# Patient Record
Sex: Male | Born: 1985 | Race: White | Hispanic: No | Marital: Married | Smoking: Never smoker
Health system: Southern US, Community
[De-identification: ages and names within clinical notes are randomized; demographics above are authoritative.]

## PROBLEM LIST (undated history)

## (undated) HISTORY — PX: LEG SURGERY: SHX1003

---
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
## 2007-08-02 ENCOUNTER — Emergency Department (HOSPITAL_COMMUNITY): Admission: EM | Admit: 2007-08-02 | Discharge: 2007-08-02 | Payer: Self-pay | Admitting: Emergency Medicine

## 2007-11-15 ENCOUNTER — Ambulatory Visit: Payer: Self-pay | Admitting: Unknown Physician Specialty

## 2007-11-16 ENCOUNTER — Ambulatory Visit: Payer: Self-pay | Admitting: Unknown Physician Specialty

## 2008-02-02 ENCOUNTER — Emergency Department: Payer: Self-pay | Admitting: Emergency Medicine

## 2008-02-02 ENCOUNTER — Emergency Department (HOSPITAL_COMMUNITY): Admission: EM | Admit: 2008-02-02 | Discharge: 2008-02-03 | Payer: Self-pay | Admitting: Emergency Medicine

## 2008-02-04 ENCOUNTER — Emergency Department (HOSPITAL_COMMUNITY): Admission: EM | Admit: 2008-02-04 | Discharge: 2008-02-04 | Payer: Self-pay | Admitting: Emergency Medicine

## 2009-05-12 IMAGING — CT CT PELVIS W/O CM
2 of 4 series · 17 of 46 positions shown, 19 images · non-contrast
Comparison: None

CT ABDOMEN

CLINICAL DATA: Right flank pain and lower pelvic pain

CT ABDOMEN AND PELVIS WITHOUT CONTRAST (CT UROGRAM)
TECHNIQUE: Multidetector CT imaging was performed through the
abdomen and pelvis to include the urinary tract.

[Series 2: >200 lbs-stone 5.0 b31f · axial · 0.92mm/px · z∈[+742,+1202]mm · 14 of 102 slices shown, 16 images]
[im 5/102  soft-tissue]
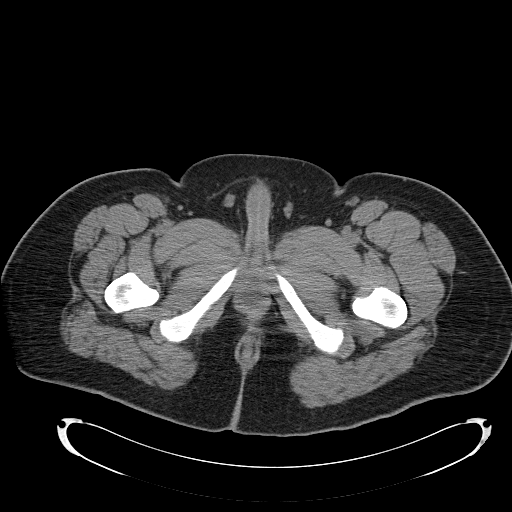
[im 5/102  bone]
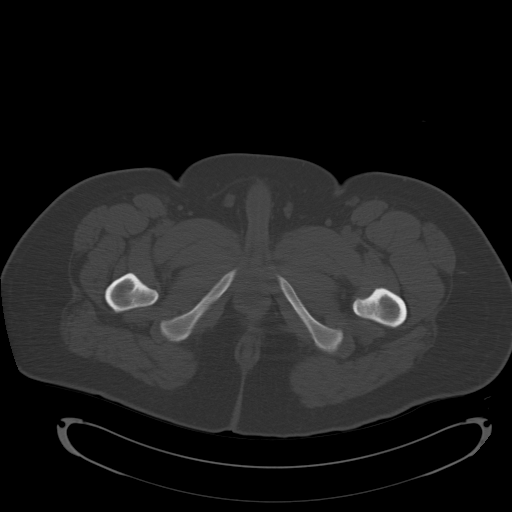
[im 13/102  soft-tissue]
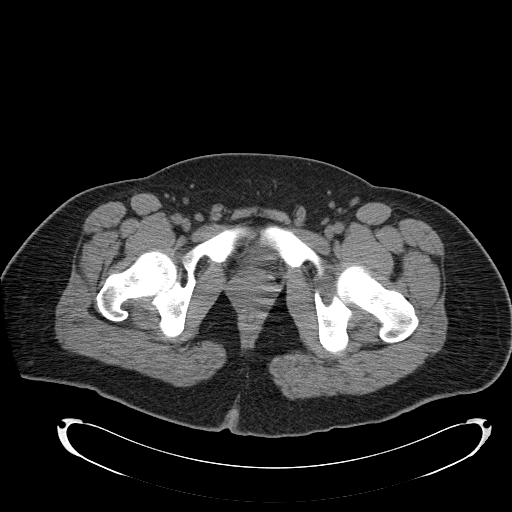
[im 22/102  soft-tissue]
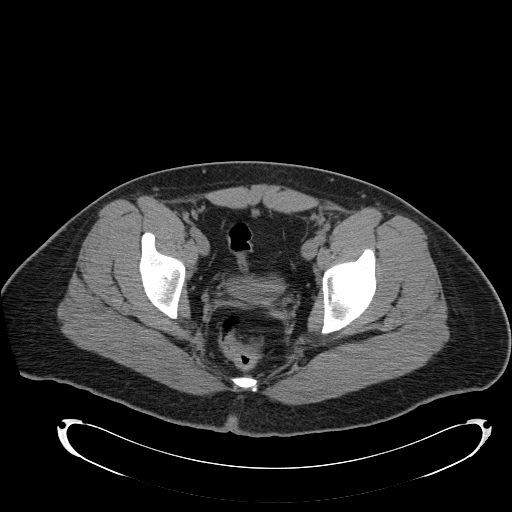
[im 26/102  soft-tissue]
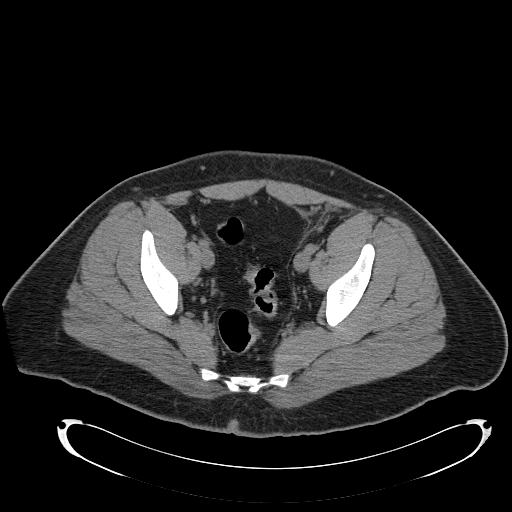
[im 34/102  soft-tissue]
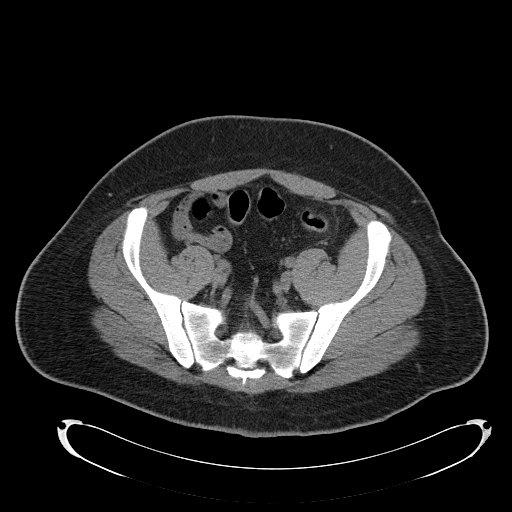
[im 43/102  soft-tissue]
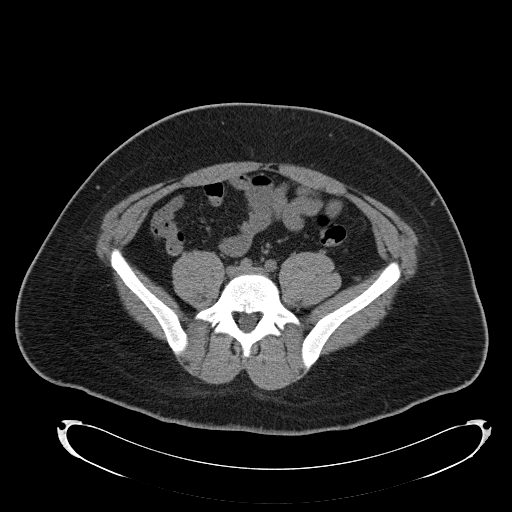
[im 47/102  soft-tissue]
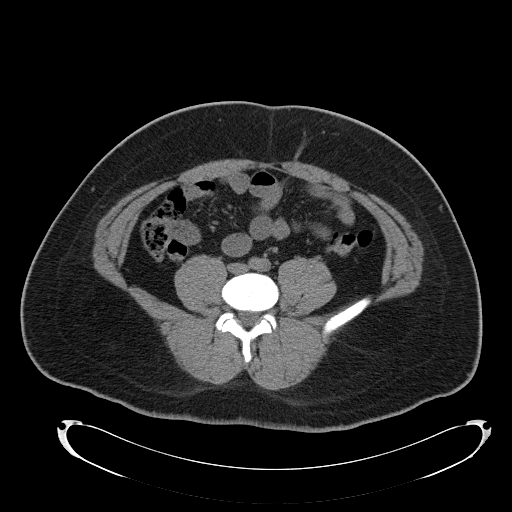
[im 55/102  soft-tissue]
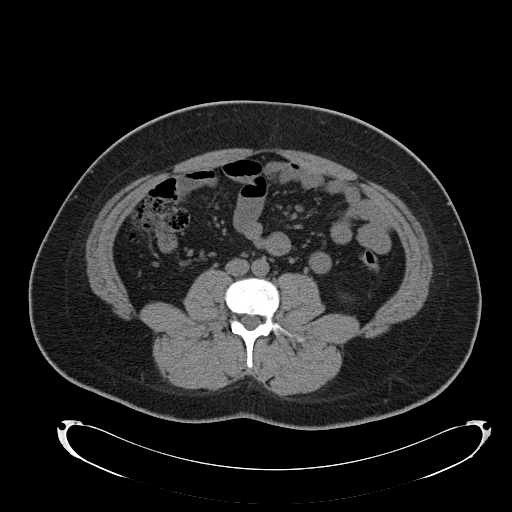
[im 59/102  soft-tissue]
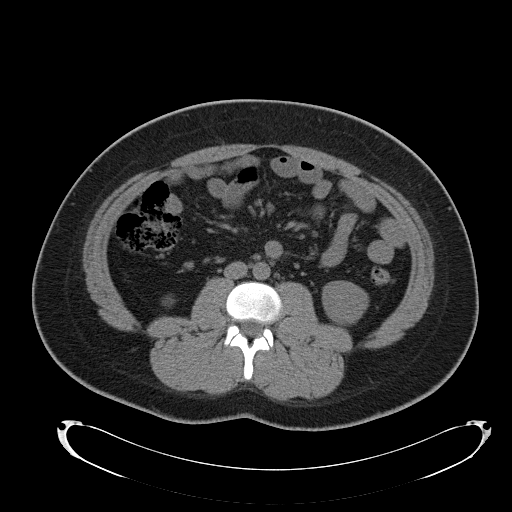
[im 59/102  bone]
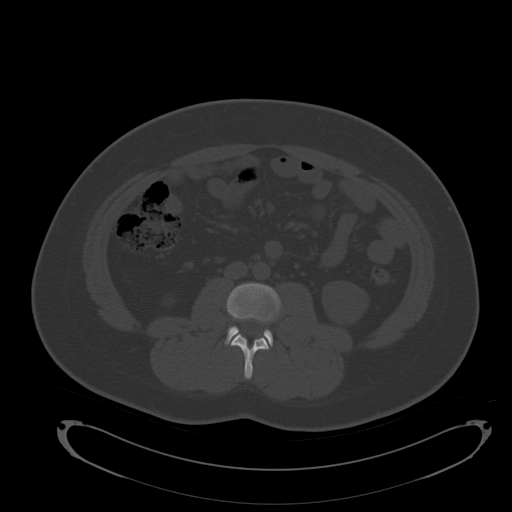
[im 68/102  soft-tissue]
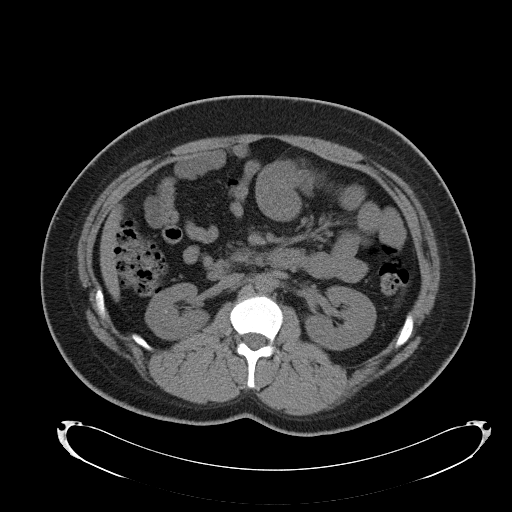
[im 76/102  soft-tissue]
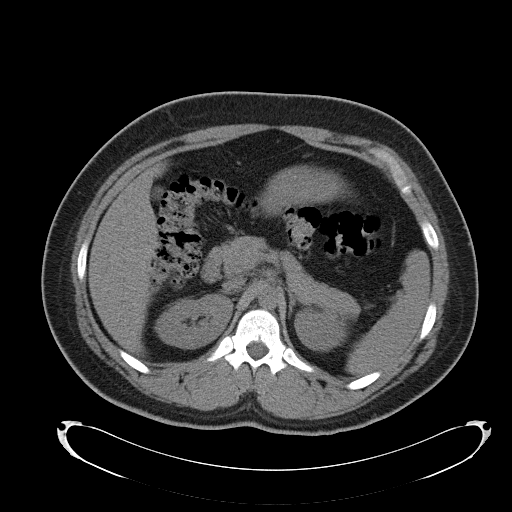
[im 80/102  soft-tissue]
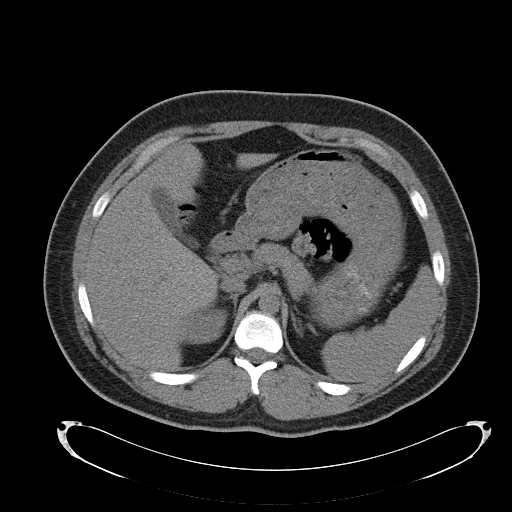
[im 89/102  soft-tissue]
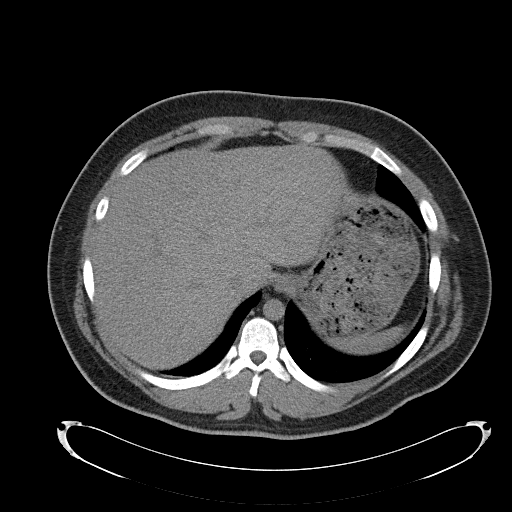
[im 97/102  soft-tissue]
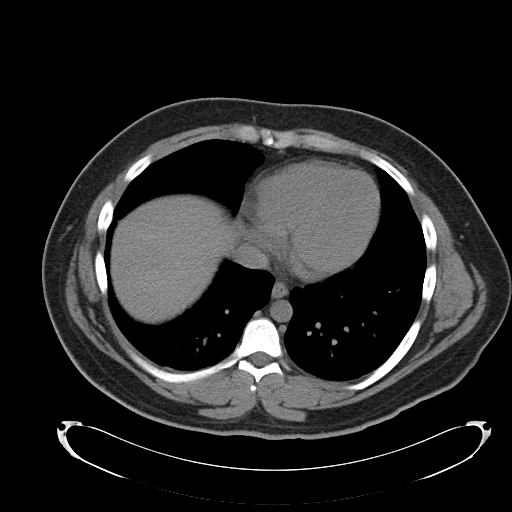

[Series 602: coronal · coronal · 0.99mm/px · 3 of 90 slices shown]
[im 30/90  soft-tissue]
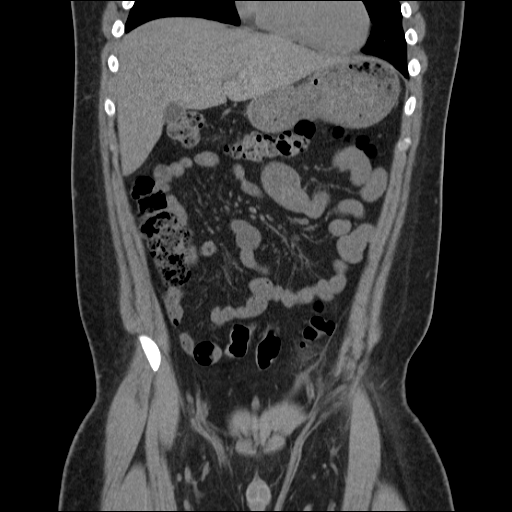
[im 40/90  soft-tissue]
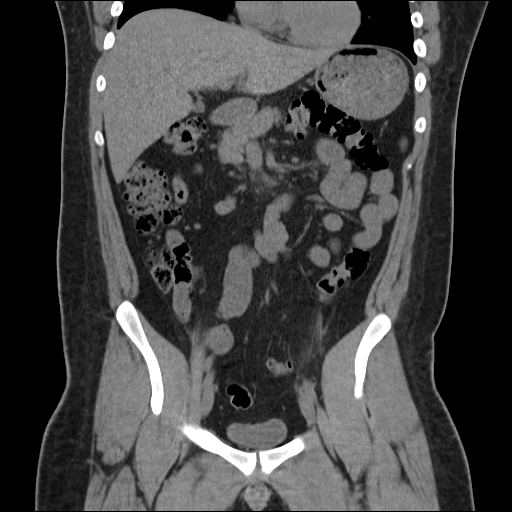
[im 50/90  soft-tissue]
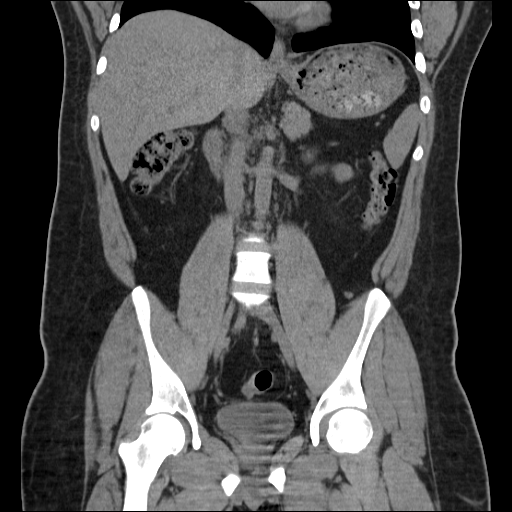

[17 of 46 positions shown; findings below may reference images not displayed]

FINDINGS: No intrarenal or proximal ureteral calculi on the left.
Small intrarenal calculi on the right. No evidence of
hydronephrosis or other secondary signs of upper urinary tract
obstruction. Within limits of unenhanced technique, normal
apprearing kidneys.

Again within limits of unenhanced technique, remaining visualized
upper abdomen unremarkable. Visualized extreme lung bases clear.
IMPRESSION: Right nephrolithiasis without obstruction.

CT PELVIS
FINDINGS: No distal ureteral calculi on either side. Appendix
identified and normal. Pelvic structures normal for age. Visualized
colon and small bowel unremarkable. No free fluid.
IMPRESSION: Normal unenhanced CT pelvis.
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
## 2010-12-31 NOTE — Discharge Summary (Signed)
[facility]  Patient:    Manuel Duarte, Manuel Duarte Visit Number: [id number] MRN: [id number]          Service Type: EMS Location: [facility] Attending Physician:  Armanda Heritage Dictated by:   Veneta Penton, M.D. Admit Date:  09/13/2001 Discharge Date: 09/13/2001                             Discharge Summary  REASON FOR ADMISSION:  This 25 year old white male was admitted complaining of depression status post overdose as a suicide attempt.  For further history of present illness, please see the patients psychiatric admission assessment.  PHYSICAL EXAMINATION:  At the time of admission was significant for a history of being overweight and having a disproportionate leg length between his both legs.  LABORATORY EXAMINATION:  The patient underwent a laboratory workup to rule out any medical problems contributing to his symptomatology.  UA was unremarkable. Urine probe for gonorrhea and chlamydia were negative.  TSH was 7.128, free T4 0.89.  Hepatic panel was within normal limits.  GGT was within normal limits. RPR was nonreactive.  The patient received no x-rays, no special procedures, no additional consultations.  He sustained no complications during the course of this hospitalization.  HOSPITAL COURSE:  On admission, the patient was psychomotor agitated with decreased concentration and attention span.  He was easily distracted by extraneous stimuli, had increased startle response, increased autonomic arousal.  His affect and mood were depressed, irritable, angry and anxious. He showed poor impulse control.  He was begun on a trial of Strattera for symptoms suggestive of ADHD as well as depressive symptoms and was continued on Celexa and titrated up to a therapeutic dose.  As he demonstrated no history of meeting criteria for bipolar disorder, he was tapered downward and taken off Trileptal.  At the time of discharge, he denies any suicidal or homicidal  ideation.  His affect and mood have improved.  He has shown no assaultive behavior.  He denies any suicidal or homicidal ideation.  He has been oppositional and defiant and testing limits in the milieu at times but does not appear to be a significant danger to himself or others and consequently is felt to have reached his maximum benefits of hospitalization and is ready for discharge to a less restrictive alternative setting.  He is able to perform all of his activities of daily living.  DIAGNOSES:  (According to DSM-IV). Axis I:    1. Major depression, recurrent, severe without psychosis.            2. Rule out bipolar disorder.            3. Conduct disorder.            4. Post-traumatic stress disorder.            5. Probable attention-deficit hyperactivity disorder. Axis II:   1. Rule out personality disorder not otherwise specified.            2. Rule out learning disorder not otherwise specified. Axis III:  1. Obesity.            2. Disproportion leg length. Axis IV:   Current psychosocial stressors are severe. Axis V:    20 on admission; 30 on discharge.  FURTHER EVALUATION AND TREATMENT RECOMMENDATIONS: 1. The patient is discharged to home. 2. He is discharged on an unrestricted level of activity and a regular diet.  His level of activity will be regulated per his orthopedic surgeon in    regard to the use of his legs. 3. He is discharged on Strattera 80 mg p.o. q.d., Celexa 40 mg p.o. q.d. 4. He will follow up with Dr. Wynonia Lawman, his outpatient psychiatrist and his    therapist at the [facility] for all further aspects of    his psychiatric care and consequently I will sign off on the case at this    time.  The patient will follow up with his orthopedic surgeon and primary    care physician for all further aspects of his medical problems. Dictated by:   Veneta Penton, M.D. Attending Physician:  Armanda Heritage DD:  09/19/01 TD:  09/19/01 Job:  [id number]

## 2011-05-12 LAB — DIFFERENTIAL
Eosinophils Relative: 2
Lymphocytes Relative: 38
Lymphs Abs: 2.6
Monocytes Absolute: 0.5

## 2011-05-12 LAB — URINALYSIS, ROUTINE W REFLEX MICROSCOPIC
Bilirubin Urine: NEGATIVE
Glucose, UA: NEGATIVE
Glucose, UA: NEGATIVE
Hgb urine dipstick: NEGATIVE
Ketones, ur: NEGATIVE
Ketones, ur: NEGATIVE
pH: 5.5
pH: 6

## 2011-05-12 LAB — CBC
HCT: 42.3
Hemoglobin: 14.8
RDW: 12.3
WBC: 6.7

## 2011-05-12 LAB — POCT I-STAT, CHEM 8
BUN: 16
Calcium, Ion: 1.22
Chloride: 101
Glucose, Bld: 102 — ABNORMAL HIGH

## 2011-05-20 LAB — CBC
HCT: 41.7
Hemoglobin: 14.8
MCHC: 35.4
MCV: 83.8
Platelets: 263
RBC: 4.98
RDW: 12.2
WBC: 8.3

## 2011-05-20 LAB — COMPREHENSIVE METABOLIC PANEL
Alkaline Phosphatase: 72
BUN: 12
CO2: 28
GFR calc non Af Amer: >60
Glucose, Bld: 95
Potassium: 4.8
Total Protein: 6.9

## 2011-05-20 LAB — COMPREHENSIVE METABOLIC PANEL WITH GFR
ALT: 21
AST: 17
Albumin: 3.9
Calcium: 10
Chloride: 101
Creatinine, Ser: 1
GFR calc Af Amer: >60
Sodium: 135
Total Bilirubin: 0.7

## 2011-05-20 LAB — URINALYSIS, ROUTINE W REFLEX MICROSCOPIC
Bilirubin Urine: NEGATIVE
Glucose, UA: NEGATIVE
Hgb urine dipstick: NEGATIVE
Ketones, ur: NEGATIVE
Nitrite: NEGATIVE
Protein, ur: NEGATIVE
Specific Gravity, Urine: 1.017
Urobilinogen, UA: 0.2
pH: 5.5

## 2011-05-20 LAB — DIFFERENTIAL
Basophils Absolute: 0
Basophils Relative: 1
Eosinophils Absolute: 0.3
Eosinophils Relative: 3
Lymphocytes Relative: 31
Lymphs Abs: 2.6
Monocytes Absolute: 0.6
Monocytes Relative: 7
Neutro Abs: 4.8
Neutrophils Relative %: 58

## 2011-05-20 LAB — LIPASE, BLOOD: Lipase: 17

## 2011-05-20 LAB — PROTIME-INR
INR: 0.9
Prothrombin Time: 12.5

## 2011-05-20 LAB — APTT: aPTT: 30

## 2012-04-21 ENCOUNTER — Emergency Department: Payer: Self-pay | Admitting: Emergency Medicine

## 2013-10-15 ENCOUNTER — Emergency Department: Payer: Self-pay | Admitting: Emergency Medicine

## 2013-10-15 LAB — COMPREHENSIVE METABOLIC PANEL
ALK PHOS: 66 U/L
AST: 12 U/L — AB (ref 15–37)
Albumin: 3.9 g/dL (ref 3.4–5.0)
Anion Gap: 6 — ABNORMAL LOW (ref 7–16)
BUN: 21 mg/dL — AB (ref 7–18)
Bilirubin,Total: 1.3 mg/dL — ABNORMAL HIGH (ref 0.2–1.0)
Calcium, Total: 9.1 mg/dL (ref 8.5–10.1)
Chloride: 99 mmol/L (ref 98–107)
Co2: 27 mmol/L (ref 21–32)
Creatinine: 1.42 mg/dL — ABNORMAL HIGH (ref 0.60–1.30)
EGFR (African American): >60
EGFR (Non-African Amer.): >60
Glucose: 104 mg/dL — ABNORMAL HIGH (ref 65–99)
OSMOLALITY: 268 (ref 275–301)
POTASSIUM: 3.7 mmol/L (ref 3.5–5.1)
SGPT (ALT): 31 U/L (ref 12–78)
SODIUM: 132 mmol/L — AB (ref 136–145)
TOTAL PROTEIN: 8.2 g/dL (ref 6.4–8.2)

## 2013-10-15 LAB — CBC WITH DIFFERENTIAL/PLATELET
BASOS ABS: 0 10*3/uL (ref 0.0–0.1)
Basophil %: 0.4 %
EOS ABS: 0 10*3/uL (ref 0.0–0.7)
EOS PCT: 0.2 %
HCT: 48.5 % (ref 40.0–52.0)
HGB: 16.2 g/dL (ref 13.0–18.0)
LYMPHS ABS: 0.9 10*3/uL — AB (ref 1.0–3.6)
Lymphocyte %: 15.1 %
MCH: 28.4 pg (ref 26.0–34.0)
MCHC: 33.4 g/dL (ref 32.0–36.0)
MCV: 85 fL (ref 80–100)
MONOS PCT: 11.3 %
Monocyte #: 0.7 x10 3/mm (ref 0.2–1.0)
Neutrophil #: 4.5 10*3/uL (ref 1.4–6.5)
Neutrophil %: 73 %
PLATELETS: 199 10*3/uL (ref 150–440)
RBC: 5.7 10*6/uL (ref 4.40–5.90)
RDW: 12.8 % (ref 11.5–14.5)
WBC: 6.1 10*3/uL (ref 3.8–10.6)

## 2013-10-15 LAB — LIPASE, BLOOD: LIPASE: 56 U/L — AB (ref 73–393)

## 2015-03-06 ENCOUNTER — Other Ambulatory Visit: Payer: Self-pay | Admitting: Pediatrics

## 2015-03-06 DIAGNOSIS — E669 Obesity, unspecified: Secondary | ICD-10-CM

## 2017-12-07 DIAGNOSIS — H9192 Unspecified hearing loss, left ear: Secondary | ICD-10-CM | POA: Diagnosis not present

## 2017-12-07 DIAGNOSIS — H6692 Otitis media, unspecified, left ear: Secondary | ICD-10-CM | POA: Diagnosis not present

## 2021-01-18 DIAGNOSIS — Z20828 Contact with and (suspected) exposure to other viral communicable diseases: Secondary | ICD-10-CM | POA: Diagnosis not present

## 2021-01-18 DIAGNOSIS — U071 COVID-19: Secondary | ICD-10-CM | POA: Diagnosis not present

## 2022-07-18 DIAGNOSIS — J019 Acute sinusitis, unspecified: Secondary | ICD-10-CM | POA: Diagnosis not present

## 2022-07-18 DIAGNOSIS — Z03818 Encounter for observation for suspected exposure to other biological agents ruled out: Secondary | ICD-10-CM | POA: Diagnosis not present

## 2022-07-18 DIAGNOSIS — J209 Acute bronchitis, unspecified: Secondary | ICD-10-CM | POA: Diagnosis not present

## 2022-07-18 DIAGNOSIS — B9689 Other specified bacterial agents as the cause of diseases classified elsewhere: Secondary | ICD-10-CM | POA: Diagnosis not present

## 2022-12-20 ENCOUNTER — Telehealth: Payer: Self-pay | Admitting: Family

## 2022-12-20 ENCOUNTER — Ambulatory Visit: Payer: BC Managed Care – PPO | Admitting: Family

## 2022-12-20 ENCOUNTER — Encounter: Payer: Self-pay | Admitting: Family

## 2022-12-20 ENCOUNTER — Encounter: Payer: Self-pay | Admitting: *Deleted

## 2022-12-20 VITALS — BP 138/88 | HR 80 | Temp 98.0°F | Ht 71.0 in | Wt 300.8 lb

## 2022-12-20 DIAGNOSIS — G43801 Other migraine, not intractable, with status migrainosus: Secondary | ICD-10-CM | POA: Diagnosis not present

## 2022-12-20 DIAGNOSIS — R1084 Generalized abdominal pain: Secondary | ICD-10-CM | POA: Diagnosis not present

## 2022-12-20 DIAGNOSIS — H8112 Benign paroxysmal vertigo, left ear: Secondary | ICD-10-CM | POA: Insufficient documentation

## 2022-12-20 DIAGNOSIS — H5500 Unspecified nystagmus: Secondary | ICD-10-CM

## 2022-12-20 DIAGNOSIS — R112 Nausea with vomiting, unspecified: Secondary | ICD-10-CM | POA: Diagnosis not present

## 2022-12-20 DIAGNOSIS — H539 Unspecified visual disturbance: Secondary | ICD-10-CM | POA: Diagnosis not present

## 2022-12-20 DIAGNOSIS — F419 Anxiety disorder, unspecified: Secondary | ICD-10-CM

## 2022-12-20 LAB — COMPREHENSIVE METABOLIC PANEL
ALT: 17 U/L (ref 0–53)
AST: 13 U/L (ref 0–37)
Albumin: 4.3 g/dL (ref 3.5–5.2)
Alkaline Phosphatase: 57 U/L (ref 39–117)
BUN: 18 mg/dL (ref 6–23)
CO2: 29 mEq/L (ref 19–32)
Calcium: 9.4 mg/dL (ref 8.4–10.5)
Chloride: 101 mEq/L (ref 96–112)
Creatinine, Ser: 1.14 mg/dL (ref 0.40–1.50)
GFR: 83.71 mL/min (ref >60.00)
Glucose, Bld: 93 mg/dL (ref 70–99)
Potassium: 4.9 mEq/L (ref 3.5–5.1)
Sodium: 136 mEq/L (ref 135–145)
Total Bilirubin: 0.7 mg/dL (ref 0.2–1.2)
Total Protein: 7.1 g/dL (ref 6.0–8.3)

## 2022-12-20 LAB — CBC
HCT: 45.2 % (ref 39.0–52.0)
Hemoglobin: 15.3 g/dL (ref 13.0–17.0)
MCHC: 33.9 g/dL (ref 30.0–36.0)
MCV: 87.7 fl (ref 78.0–100.0)
Platelets: 231 10*3/uL (ref 150.0–400.0)
RBC: 5.15 Mil/uL (ref 4.22–5.81)
RDW: 12.7 % (ref 11.5–15.5)
WBC: 4.8 10*3/uL (ref 4.0–10.5)

## 2022-12-20 LAB — SEDIMENTATION RATE: Sed Rate: 14 mm/hr (ref 0–15)

## 2022-12-20 LAB — C-REACTIVE PROTEIN: CRP: <1 mg/dL (ref 0.5–20.0)

## 2022-12-20 LAB — TSH: TSH: 2.4 u[IU]/mL (ref 0.35–5.50)

## 2022-12-20 MED ORDER — SUMATRIPTAN SUCCINATE 25 MG PO TABS: ORAL_TABLET | ORAL | 0 refills | Status: DC

## 2022-12-20 MED ORDER — ONDANSETRON HCL 4 MG PO TABS: 4.0000 mg | ORAL_TABLET | Freq: Three times a day (TID) | ORAL | 0 refills | Status: DC | PRN

## 2022-12-20 NOTE — Progress Notes (Signed)
Established Patient Office Visit  Subjective:   Patient ID: Manuel Duarte, male    DOB: 1985-10-08  Age: 37 y.o. MRN: [id number]  CC:  Chief Complaint  Patient presents with   Abdominal Pain   Nausea   Eye Problem    See dots    HPI: Manuel R Jantz is a 37 y.o. male presenting on 12/20/2022 for Abdominal Pain, Nausea, and Eye Problem (See dots)   Abdominal Pain  Eye Problem     Since January has had ongoing dizziness, nausea with vomiting, and left eye with spots in his eyes at times (will twitch and get blurry when this occurs). He is worried because starting to become more frequent.does not feel the stress has changed anything more significantly more than usual. He finds this happens more when he is not in periods of stress. Since January if he gets into the back seat of a car the symptoms will occur, but not always. Yesterday while sitting at his desk he had to get up and went into the bathroom and vomited as well. He does also have left lower to mid right abdomen that is not constant, but does seem to occur during the episodes. He thinks the abdominal pain is more of a result of the vomiting episode as typically occurs after vomiting. When he initially get dizzy he was feeling it at the top of his head, but now is noticing it will start at the top and progress to the back of his head.   Back in January , the first episode occurred after getting off a cruise ship. And was occurring weekly until it progressed to about 2-3 times a week, and now has progressed to every other day. No worsening dizziness with sudden change of motion/movement.   Does have some diarrhea at some times however not always.   Does have anxiety that he has struggled with for some time, and has never taken any medication for this however anxiety is not currently controlled.  No cp palp and or sob         ROS: Negative unless specifically indicated above in HPI.   Relevant past medical history  reviewed and updated as indicated.   Allergies and medications reviewed and updated.   Current Outpatient Medications:    IBUPROFEN PO, Take 200 mg by mouth every 4 (four) hours as needed., Disp: , Rfl:    ondansetron (ZOFRAN) 4 MG tablet, Take 1 tablet (4 mg total) by mouth every 8 (eight) hours as needed for nausea or vomiting., Disp: 20 tablet, Rfl: 0   SUMAtriptan (IMITREX) 25 MG tablet, Take one po qd prn migraine onset, may repeat dose x 1 after at least 2 hours, Disp: 10 tablet, Rfl: 0  No Known Allergies  Objective:   BP 138/88 (BP Location: Left Arm)   Pulse 80   Temp 98 F (36.7 C) (Temporal)   Ht 5\' 11"  (1.803 m)   Wt (!) 300 lb 12.8 oz (136.4 kg)   SpO2 98%   BMI 41.95 kg/m    Physical Exam HENT:     Right Ear: Hearing, tympanic membrane, ear canal and external ear normal.     Left Ear: Hearing, tympanic membrane, ear canal and external ear normal.  Eyes:     General: Lids are normal.     Extraocular Movements: Extraocular movements intact.     Right eye: Normal extraocular motion.     Left eye: Nystagmus (slight on left when external rotation  to left side) present.  Neurological:     General: No focal deficit present.     Mental Status: He is alert and oriented to person, place, and time.     Cranial Nerves: Cranial nerves 2-12 are intact. No cranial nerve deficit or facial asymmetry.     Sensory: Sensation is intact.     Motor: Motor function is intact.     Coordination: Coordination is intact.     Gait: Gait is intact.     Comments: Slight nystagmus with rotation of head to left side while supine      Assessment & Plan:  Nausea and vomiting, unspecified vomiting type -     Ambulatory referral to Neurology -     CBC -     Ondansetron HCl; Take 1 tablet (4 mg total) by mouth every 8 (eight) hours as needed for nausea or vomiting.  Dispense: 20 tablet; Refill: 0  Nystagmus of left eye -     Ambulatory referral to Ophthalmology -     Ambulatory  referral to Neurology  Visual changes Assessment & Plan: Referral to opthalmology.   Orders: -     Ambulatory referral to Ophthalmology -     Ambulatory referral to Neurology -     Sedimentation rate -     C-reactive protein -     TSH  Ocular migraine with status migrainosus Assessment & Plan: Suspected ocular migraine with symptoms.  Rx sumatriptan to use prn  Zofran prn nausea/vomiting.  Referral placed for ophthalmology to confirm or r/o other etiologies.  Will also refer to neurology per pt request.  Ordering sed rate, crp, cbc, cmp and also tsh to r/o  Orders: -     Ambulatory referral to Ophthalmology -     Ambulatory referral to Neurology -     Sedimentation rate -     C-reactive protein -     TSH -     SUMAtriptan Succinate; Take one po qd prn migraine onset, may repeat dose x 1 after at least 2 hours  Dispense: 10 tablet; Refill: 0  Benign paroxysmal positional vertigo of left ear Assessment & Plan: Slight nystagmus on exam however pt has tried meclizine in the past without relief.  Going to treat suspected ocular migraine to see if this helps reduce symptoms.   Orders: -     Ambulatory referral to Neurology  Generalized abdominal pain -     CBC -     Comprehensive metabolic panel  Anxiety Assessment & Plan: Will treat suspected ocular migraines first, and then hopeful that anxiety will decrease.  If not will consider buspirone. Did offer pt therapy consult as well, pt will consider this.      Follow up plan: Return in about 2 weeks (around 01/03/2023) for f/u ocular concerns.  Mort Sawyers, FNP

## 2022-12-20 NOTE — Assessment & Plan Note (Signed)
Slight nystagmus on exam however pt has tried meclizine in the past without relief.  Going to treat suspected ocular migraine to see if this helps reduce symptoms.

## 2022-12-20 NOTE — Telephone Encounter (Signed)
Patient called in regarding someone trying to call him about his insurance card not being scanned in.He scanned them in via [employer].

## 2022-12-20 NOTE — Patient Instructions (Signed)
  A referral was placed today for both neurology and ophthalmologist  Please let us know if you have not heard back within 2 weeks about the referral.

## 2022-12-20 NOTE — Assessment & Plan Note (Addendum)
Suspected ocular migraine with symptoms.  Rx sumatriptan to use prn  Zofran prn nausea/vomiting.  Referral placed for ophthalmology to confirm or r/o other etiologies.  Will also refer to neurology per pt request.  Ordering sed rate, crp, cbc, cmp and also tsh to r/o

## 2022-12-20 NOTE — Assessment & Plan Note (Signed)
Will treat suspected ocular migraines first, and then hopeful that anxiety will decrease.  If not will consider buspirone. Did offer pt therapy consult as well, pt will consider this.

## 2022-12-20 NOTE — Assessment & Plan Note (Signed)
Referral to opthalmology  

## 2022-12-20 NOTE — Telephone Encounter (Signed)
I'm not sure who would have called this patient. I see his cards scanned into the chart.

## 2022-12-26 DIAGNOSIS — H532 Diplopia: Secondary | ICD-10-CM | POA: Diagnosis not present

## 2022-12-26 DIAGNOSIS — R42 Dizziness and giddiness: Secondary | ICD-10-CM | POA: Diagnosis not present

## 2022-12-26 DIAGNOSIS — H524 Presbyopia: Secondary | ICD-10-CM | POA: Diagnosis not present

## 2022-12-28 ENCOUNTER — Encounter: Payer: Self-pay | Admitting: Neurology

## 2022-12-28 NOTE — Progress Notes (Deleted)
Initial neurology clinic note  Manuel R Frede MRN: [id number] DOB: 1986/01/06  Referring provider: Mort Sawyers, FNP  Primary care provider: Mort Sawyers, FNP  Reason for consult:  ***  Subjective:  This is Mr. Manuel Duarte, a 37 y.o. ***-handed male with a medical history of *** who presents to neurology clinic with ***. The patient is accompanied by ***.  ***  Dizziness, nausea and vomiting, and left eye blurriness since 08/2022 Started after getting off of a cruise ship Started happening weekly, then progressed to 2-3 times per week, now every other day Felt to be secondary to ocular migraine Referred to ophtho and neurology On sumatriptan and zofran (prescribed by PCP on 12/20/22)***  Patient was seen at [facility] on 12/26/22. Per documentation, vision is 20/20 bilaterally with normal IO pressure. Optic discs were normal. Documentation mentions possible MS and ?MRI though notes are handwritten and difficult to decipher.  ***   MEDICATIONS:  Outpatient Encounter Medications as of 01/04/2023  Medication Sig   IBUPROFEN PO Take 200 mg by mouth every 4 (four) hours as needed.   ondansetron (ZOFRAN) 4 MG tablet Take 1 tablet (4 mg total) by mouth every 8 (eight) hours as needed for nausea or vomiting.   SUMAtriptan (IMITREX) 25 MG tablet Take one po qd prn migraine onset, may repeat dose x 1 after at least 2 hours   No facility-administered encounter medications on file as of 01/04/2023.    PAST MEDICAL HISTORY: No past medical history on file.  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  SOCIAL HISTORY: Social History   Tobacco Use   Smoking status: Never   Smokeless tobacco: Never  Substance Use Topics   Alcohol use: Yes    Comment: occasional    Drug use: Never   Social History   Social History Narrative   Not on file    Objective:  Vital Signs:  There were no vitals taken for this visit.  ***  Labs and Imaging review: Internal labs: No results found for: "HGBA1C" No results found for: "VITAMINB12" Lab Results  Component Value Date   TSH 2.40 12/20/2022   Lab Results  Component Value Date   ESRSEDRATE 14 12/20/2022   CRP (12/20/22): < 1.0 CMP (12/20/22): unremarkable CBC (12/20/22): unremarkable  External labs: ***  Imaging: ***  Assessment/Plan:  Manuel R Matthew is a 37 y.o. male who presents for evaluation of ***. *** has a relevant medical history of ***. *** neurological examination is pertinent for ***. Available diagnostic data is significant for ***. This constellation of symptoms and objective data would most likely localize to ***. ***  PLAN: -Blood work: *** ***  -Return to clinic ***  The impression above as well as the plan as outlined below were extensively discussed with the patient (in the company of ***) who voiced understanding. All questions were answered to their satisfaction.  The patient was counseled on pertinent fall precautions per the printed material provided today, and as noted under the "Patient Instructions" section below.***  When available, results of the above investigations and possible further recommendations will be communicated to the patient via telephone/MyChart. Patient to call office if not contacted after expected testing turnaround time.   Total time spent reviewing records, interview,  history/exam, documentation, and coordination of care on day of encounter:  *** min   Thank you for allowing me to participate in patient's care.  If I can answer any additional questions, I would be pleased to do so.  Jacquelyne Balint, MD   CC: Mort Sawyers, FNP [address] [place] [address]  CC: Referring provider: Mort Sawyers, FNP [address] [place],  [place] [address]

## 2022-12-30 DIAGNOSIS — Z114 Encounter for screening for human immunodeficiency virus [HIV]: Secondary | ICD-10-CM | POA: Diagnosis not present

## 2022-12-30 DIAGNOSIS — R0683 Snoring: Secondary | ICD-10-CM | POA: Diagnosis not present

## 2022-12-30 DIAGNOSIS — R202 Paresthesia of skin: Secondary | ICD-10-CM | POA: Diagnosis not present

## 2023-01-03 ENCOUNTER — Other Ambulatory Visit: Payer: Self-pay | Admitting: Neurology

## 2023-01-03 ENCOUNTER — Ambulatory Visit: Payer: BC Managed Care – PPO | Admitting: Family

## 2023-01-03 ENCOUNTER — Encounter: Payer: Self-pay | Admitting: Family

## 2023-01-03 VITALS — BP 122/89 | HR 74 | Temp 97.7°F | Ht 71.0 in | Wt 302.4 lb

## 2023-01-03 DIAGNOSIS — R112 Nausea with vomiting, unspecified: Secondary | ICD-10-CM | POA: Diagnosis not present

## 2023-01-03 DIAGNOSIS — E559 Vitamin D deficiency, unspecified: Secondary | ICD-10-CM | POA: Insufficient documentation

## 2023-01-03 DIAGNOSIS — H539 Unspecified visual disturbance: Secondary | ICD-10-CM | POA: Diagnosis not present

## 2023-01-03 DIAGNOSIS — E538 Deficiency of other specified B group vitamins: Secondary | ICD-10-CM | POA: Diagnosis not present

## 2023-01-03 DIAGNOSIS — R202 Paresthesia of skin: Secondary | ICD-10-CM

## 2023-01-03 MED ORDER — CHOLECALCIFEROL 1.25 MG (50000 UT) PO TABS
1.0000 | ORAL_TABLET | ORAL | 0 refills | Status: DC
Start: 2023-01-03 — End: 2023-07-07

## 2023-01-03 MED ORDER — ONDANSETRON HCL 4 MG PO TABS: 4.0000 mg | ORAL_TABLET | Freq: Three times a day (TID) | ORAL | 0 refills | Status: AC | PRN

## 2023-01-03 MED ORDER — CYANOCOBALAMIN 1000 MCG/ML IJ SOLN
INTRAMUSCULAR | 2 refills | Status: DC
Start: 2023-01-03 — End: 2023-01-03

## 2023-01-03 MED ORDER — CYANOCOBALAMIN 1000 MCG/ML IJ SOLN
1000.0000 ug | Freq: Once | INTRAMUSCULAR | Status: AC
Start: 2023-01-03 — End: 2023-01-03
  Administered 2023-01-03: 1000 ug via INTRAMUSCULAR

## 2023-01-03 NOTE — Patient Instructions (Signed)
  We are going to do b12 injections once a month x 3 months.   Your vitamin D was a bit on the lower range. I will send an RX for vitamin D3 50,000 IU which you will take once weekly for 8 weeks. Once RX is complete, please continue over the counter Vitamin D3 1000 IU once daily. Return to the clinic for a follow up in three months to repeat your Vitamin D level.    Regards,   Mort Sawyers FNP-C

## 2023-01-03 NOTE — Progress Notes (Signed)
Established Patient Office Visit  Subjective:      CC:  Chief Complaint  Patient presents with   Medical Management of Chronic Issues    Dizziness/nausea    HPI: Manuel Duarte is a 37 y.o. male presenting on 01/03/2023 for Medical Management of Chronic Issues (Dizziness/nausea) . Still ongoing dizziness and nausea.  Has since been evaluated by neurology (Dr. Sherryll Burger) and ophthalmology ([facility]) Was found to have abnormality behind left eye, ophthalmology suspected MS vs Myasthenia Gravis and referral placed for neurology. Neurology has ordered MRI w w/o contrast.   Concern for sleep apnea in neurology note, they will order sleep study.        Social history:  Relevant past medical, surgical, family and social history reviewed and updated as indicated. Interim medical history since our last visit reviewed.  Allergies and medications reviewed and updated.  DATA REVIEWED: CHART IN EPIC     ROS: Negative unless specifically indicated above in HPI.    Current Outpatient Medications:    Cholecalciferol 1.25 MG (50000 UT) TABS, Take 1 tablet by mouth once a week., Disp: 8 tablet, Rfl: 0   IBUPROFEN PO, Take 200 mg by mouth every 4 (four) hours as needed., Disp: , Rfl:    ondansetron (ZOFRAN) 4 MG tablet, Take 1 tablet (4 mg total) by mouth every 8 (eight) hours as needed for nausea or vomiting., Disp: 20 tablet, Rfl: 0      Objective:    BP 122/89   Pulse 74   Temp 97.7 F (36.5 C) (Temporal)   Ht 5\' 11"  (1.803 m)   Wt (!) 302 lb 6.4 oz (137.2 kg)   SpO2 98%   BMI 42.18 kg/m   Wt Readings from Last 3 Encounters:  01/03/23 (!) 302 lb 6.4 oz (137.2 kg)  12/20/22 (!) 300 lb 12.8 oz (136.4 kg)    Physical Exam Constitutional:      General: He is not in acute distress.    Appearance: Normal appearance. He is normal weight. He is not ill-appearing, toxic-appearing or diaphoretic.  HENT:     Head: Normocephalic.  Cardiovascular:     Rate and  Rhythm: Normal rate and regular rhythm.  Pulmonary:     Effort: Pulmonary effort is normal.     Breath sounds: Normal breath sounds.  Abdominal:     Tenderness: There is no abdominal tenderness.  Musculoskeletal:        General: Normal range of motion.  Neurological:     General: No focal deficit present.     Mental Status: He is alert and oriented to person, place, and time. Mental status is at baseline.     Sensory: Sensation is intact.     Motor: Motor function is intact.     Coordination: Coordination is intact.     Gait: Gait is intact.  Psychiatric:        Mood and Affect: Mood normal.        Behavior: Behavior normal.        Thought Content: Thought content normal.        Judgment: Judgment normal.           Assessment & Plan:  Vitamin B12 deficiency Assessment & Plan: Ordered b12 pending results set up for B12 injections, 1000 mcg IM once monthly for three months Recommend also oral otc 1000 mcg once daily vitamin B12     Nausea and vomiting, unspecified vomiting type Assessment & Plan: Ongoing  Currently under  evaluation by neurology and ophthalmologist for evaluation for suspected MS.   Orders: -     Ondansetron HCl; Take 1 tablet (4 mg total) by mouth every 8 (eight) hours as needed for nausea or vomiting.  Dispense: 20 tablet; Refill: 0  Vitamin D deficiency Assessment & Plan: Sent an RX for vitamin D3 50,000 IU which you will take once weekly for 8 weeks. Once RX is complete, please continue over the counter Vitamin D3 1000 IU once daily. Return to the clinic for a follow up in three months to repeat your Vitamin D level.    Orders: -     Cholecalciferol; Take 1 tablet by mouth once a week.  Dispense: 8 tablet; Refill: 0  Visual changes Assessment & Plan: Continue f/u with ophthalmologist       Return in about 6 months (around 07/06/2023) for f/u CPE.  Mort Sawyers, MSN, APRN, FNP-C [facility]

## 2023-01-03 NOTE — Assessment & Plan Note (Signed)
Continue f/u with ophthalmologist

## 2023-01-03 NOTE — Assessment & Plan Note (Signed)
Ordered b12 pending results set up for B12 injections, 1000 mcg IM once monthly for three months Recommend also oral otc 1000 mcg once daily vitamin B12

## 2023-01-03 NOTE — Assessment & Plan Note (Signed)
Sent an RX for vitamin D3 50,000 IU which you will take once weekly for 8 weeks. Once RX is complete, please continue over the counter Vitamin D3 1000 IU once daily. Return to the clinic for a follow up in three months to repeat your Vitamin D level.

## 2023-01-03 NOTE — Assessment & Plan Note (Signed)
Ongoing  Currently under evaluation by neurology and ophthalmologist for evaluation for suspected MS.

## 2023-01-04 ENCOUNTER — Encounter: Payer: Self-pay | Admitting: Neurology

## 2023-01-04 ENCOUNTER — Ambulatory Visit: Payer: BC Managed Care – PPO | Admitting: Neurology

## 2023-01-23 ENCOUNTER — Ambulatory Visit
Admission: RE | Admit: 2023-01-23 | Discharge: 2023-01-23 | Disposition: A | Discharge status: Home / Self Care | Payer: BC Managed Care – PPO | Source: Ambulatory Visit | Attending: Neurology | Admitting: Neurology

## 2023-01-23 DIAGNOSIS — R202 Paresthesia of skin: Secondary | ICD-10-CM

## 2023-01-23 DIAGNOSIS — R200 Anesthesia of skin: Secondary | ICD-10-CM | POA: Diagnosis not present

## 2023-01-23 DIAGNOSIS — R42 Dizziness and giddiness: Secondary | ICD-10-CM | POA: Diagnosis not present

## 2023-01-23 MED ORDER — GADOPICLENOL 0.5 MMOL/ML IV SOLN
10.0000 mL | Freq: Once | INTRAVENOUS | Status: AC | PRN
  Administered 2023-01-23: 10 mL via INTRAVENOUS

## 2023-02-07 DIAGNOSIS — R202 Paresthesia of skin: Secondary | ICD-10-CM | POA: Diagnosis not present

## 2023-02-07 DIAGNOSIS — E538 Deficiency of other specified B group vitamins: Secondary | ICD-10-CM | POA: Diagnosis not present

## 2023-02-09 ENCOUNTER — Ambulatory Visit: Payer: Self-pay | Admitting: Family

## 2023-03-21 ENCOUNTER — Encounter: Payer: Self-pay | Admitting: Family

## 2023-03-21 ENCOUNTER — Ambulatory Visit: Payer: BC Managed Care – PPO | Admitting: Family

## 2023-03-21 VITALS — BP 138/100 | HR 85 | Temp 98.4°F | Ht 71.0 in | Wt 295.2 lb

## 2023-03-21 DIAGNOSIS — F419 Anxiety disorder, unspecified: Secondary | ICD-10-CM

## 2023-03-21 DIAGNOSIS — F411 Generalized anxiety disorder: Secondary | ICD-10-CM

## 2023-03-21 DIAGNOSIS — H539 Unspecified visual disturbance: Secondary | ICD-10-CM

## 2023-03-21 MED ORDER — ALPRAZOLAM 0.25 MG PO TABS
0.2500 mg | ORAL_TABLET | Freq: Every day | ORAL | 0 refills | Status: AC | PRN
Start: 2023-03-21 — End: ?

## 2023-03-21 MED ORDER — BUSPIRONE HCL 5 MG PO TABS
5.0000 mg | ORAL_TABLET | Freq: Two times a day (BID) | ORAL | 0 refills | Status: DC
Start: 2023-03-21 — End: 2023-06-16

## 2023-03-21 NOTE — Assessment & Plan Note (Signed)
Ongoing evaluation with neurology  Continue f/u as scheduled.

## 2023-03-21 NOTE — Assessment & Plan Note (Signed)
Start buspirone 5 mg twice daily  Alprazolam 0.25 mg once daily prn anxiety  Pdmp reviewed.  Pt with no h/o addictive behavior Did suggest therapy however he declines today

## 2023-03-21 NOTE — Progress Notes (Signed)
Established Patient Office Visit  Subjective:   Patient ID: Manuel Duarte, male    DOB: [birth year]  Age: 37 y.o. MRN: [id number]  CC:  Chief Complaint  Patient presents with   Medical Management of Chronic Issues    F/u from last OV   Anxiety    HPI: Manuel Duarte is a 37 y.o. male presenting on 03/21/2023 for Medical Management of Chronic Issues (F/u from last OV) and Anxiety  Anxiety, ongoing. Worsening. He states at time with increased stress, and hard to focus and feels work is increasing to stress. Starting to affect his marriage. Feeling very drained emotionally and no longer with desire to do hobbies that he used to like to work on. Restless hard for him to be still. Sleeping but after sleeping he feels not well rested. He has been told at times that he snores as well. Not doing anything impulsive such as gambling spending excessive money and or driving faster than should. He gets about 5-6 hours of sleep but this is normal for him. He does state that his sister is on xanax and he took one at one time and it was helpful for his heightened anxiety.      03/21/2023   12:23 PM 01/03/2023    8:53 AM 12/20/2022    8:54 AM  GAD 7 : Generalized Anxiety Score  Nervous, Anxious, on Edge 3 3 3   Control/stop worrying 3 2 3   Worry too much - different things 3 2 3   Trouble relaxing 3 2 3   Restless 3 2 3   Easily annoyed or irritable 2 2 3   Afraid - awful might happen 3 2 3   Total GAD 7 Score 20 15 21   Anxiety Difficulty Very difficult Very difficult Very difficult       03/21/2023   12:23 PM 01/03/2023    8:52 AM 12/20/2022    8:53 AM  PHQ9 SCORE ONLY  PHQ-9 Total Score 13 16 0          ROS: Negative unless specifically indicated above in HPI.   Relevant past medical history reviewed and updated as indicated.   Allergies and medications reviewed and updated.   Current Outpatient Medications:    ALPRAZolam (XANAX) 0.25 MG tablet, Take 1 tablet (0.25 mg total) by mouth  daily as needed for anxiety., Disp: 20 tablet, Rfl: 0   busPIRone (BUSPAR) 5 MG tablet, Take 1 tablet (5 mg total) by mouth 2 (two) times daily., Disp: 180 tablet, Rfl: 0   Cholecalciferol 1.25 MG (50000 UT) TABS, Take 1 tablet by mouth once a week., Disp: 8 tablet, Rfl: 0   IBUPROFEN PO, Take 200 mg by mouth every 4 (four) hours as needed., Disp: , Rfl:    ondansetron (ZOFRAN) 4 MG tablet, Take 1 tablet (4 mg total) by mouth every 8 (eight) hours as needed for nausea or vomiting., Disp: 20 tablet, Rfl: 0  No Known Allergies  Objective:   BP (!) 138/100   Pulse 85   Temp 98.4 F (36.9 C) (Temporal)   Ht 5\' 11"  (1.803 m)   Wt 295 lb 4 oz (133.9 kg)   SpO2 97%   BMI 41.18 kg/m    Physical Exam Constitutional:      General: He is not in acute distress.    Appearance: Normal appearance. He is normal weight. He is not ill-appearing, toxic-appearing or diaphoretic.  Cardiovascular:     Rate and Rhythm: Normal rate and regular rhythm.  Pulmonary:  Effort: Pulmonary effort is normal.     Breath sounds: Normal breath sounds.  Musculoskeletal:        General: Normal range of motion.  Neurological:     General: No focal deficit present.     Mental Status: He is alert and oriented to person, place, and time. Mental status is at baseline.  Psychiatric:        Mood and Affect: Mood normal.        Behavior: Behavior normal.        Thought Content: Thought content normal.        Judgment: Judgment normal.    Wt Readings from Last 3 Encounters:  03/21/23 295 lb 4 oz (133.9 kg)  01/03/23 (!) 302 lb 6.4 oz (137.2 kg)  12/20/22 (!) 300 lb 12.8 oz (136.4 kg)   Temp Readings from Last 3 Encounters:  03/21/23 98.4 F (36.9 C) (Temporal)  01/03/23 97.7 F (36.5 C) (Temporal)  12/20/22 98 F (36.7 C) (Temporal)   BP Readings from Last 3 Encounters:  03/21/23 (!) 138/100  01/03/23 122/89  12/20/22 138/88   Pulse Readings from Last 3 Encounters:  03/21/23 85  01/03/23 74   12/20/22 80     Assessment & Plan:  Anxiety Assessment & Plan: Start buspirone 5 mg twice daily  Alprazolam 0.25 mg once daily prn anxiety  Pdmp reviewed.  Pt with no h/o addictive behavior Did suggest therapy however he declines today    Orders: -     ALPRAZolam; Take 1 tablet (0.25 mg total) by mouth daily as needed for anxiety.  Dispense: 20 tablet; Refill: 0 -     busPIRone HCl; Take 1 tablet (5 mg total) by mouth 2 (two) times daily.  Dispense: 180 tablet; Refill: 0  Anxiety state -     ALPRAZolam; Take 1 tablet (0.25 mg total) by mouth daily as needed for anxiety.  Dispense: 20 tablet; Refill: 0 -     busPIRone HCl; Take 1 tablet (5 mg total) by mouth 2 (two) times daily.  Dispense: 180 tablet; Refill: 0  Visual changes Assessment & Plan: Ongoing evaluation with neurology  Continue f/u as scheduled.       Follow up plan: Return in about 3 weeks (around 04/11/2023) for f/u anxiety.  Mort Sawyers, FNP

## 2023-06-16 ENCOUNTER — Other Ambulatory Visit: Payer: Self-pay | Admitting: Family

## 2023-06-16 DIAGNOSIS — F419 Anxiety disorder, unspecified: Secondary | ICD-10-CM

## 2023-06-16 DIAGNOSIS — F411 Generalized anxiety disorder: Secondary | ICD-10-CM

## 2023-06-16 NOTE — Telephone Encounter (Signed)
Pt overdue f/u  30 day given for buspirone but pt needs f/u prior to next refill.Marland Kitchen

## 2023-07-02 ENCOUNTER — Other Ambulatory Visit: Payer: Self-pay | Admitting: Family

## 2023-07-02 DIAGNOSIS — F411 Generalized anxiety disorder: Secondary | ICD-10-CM

## 2023-07-02 DIAGNOSIS — F419 Anxiety disorder, unspecified: Secondary | ICD-10-CM

## 2023-07-03 MED ORDER — BUSPIRONE HCL 5 MG PO TABS
5.0000 mg | ORAL_TABLET | Freq: Two times a day (BID) | ORAL | 0 refills | Status: DC
Start: 2023-07-03 — End: 2023-07-07

## 2023-07-03 NOTE — Telephone Encounter (Signed)
Pt overdue for appt needs to be scheduled for future refills.

## 2023-07-03 NOTE — Telephone Encounter (Signed)
Pt called checking status of med refill? Call back # [phone]

## 2023-07-07 ENCOUNTER — Ambulatory Visit (INDEPENDENT_AMBULATORY_CARE_PROVIDER_SITE_OTHER): Payer: BC Managed Care – PPO | Admitting: Family

## 2023-07-07 ENCOUNTER — Encounter: Payer: Self-pay | Admitting: Family

## 2023-07-07 DIAGNOSIS — F411 Generalized anxiety disorder: Secondary | ICD-10-CM

## 2023-07-07 DIAGNOSIS — F419 Anxiety disorder, unspecified: Secondary | ICD-10-CM

## 2023-07-07 MED ORDER — BUSPIRONE HCL 5 MG PO TABS: 5.0000 mg | ORAL_TABLET | Freq: Two times a day (BID) | ORAL | 0 refills | Status: AC

## 2023-07-07 NOTE — Progress Notes (Signed)
Established Patient Office Visit  Subjective:      CC:  Chief Complaint  Patient presents with   Medical Management of Chronic Issues    HPI: Manuel Duarte is a 37 y.o. male presenting on 07/07/2023 for Medical Management of Chronic Issues . GAD: doing well on buspirone 5 mg daily however ran out about a week ago. Alprazolam, only needs rarely, every few weeks or so. Only when he goes anywhere for social anxiety reasons. He states it has been helping for his anxiety especially at work. He does have some stressors, specifically at home that can be triggering and frustrating.       Social history:  Relevant past medical, surgical, family and social history reviewed and updated as indicated. Interim medical history since our last visit reviewed.  Allergies and medications reviewed and updated.  DATA REVIEWED: CHART IN EPIC     ROS: Negative unless specifically indicated above in HPI.    Current Outpatient Medications:    ALPRAZolam (XANAX) 0.25 MG tablet, Take 1 tablet (0.25 mg total) by mouth daily as needed for anxiety., Disp: 20 tablet, Rfl: 0   IBUPROFEN PO, Take 200 mg by mouth every 4 (four) hours as needed., Disp: , Rfl:    ondansetron (ZOFRAN) 4 MG tablet, Take 1 tablet (4 mg total) by mouth every 8 (eight) hours as needed for nausea or vomiting., Disp: 20 tablet, Rfl: 0   busPIRone (BUSPAR) 5 MG tablet, Take 1 tablet (5 mg total) by mouth 2 (two) times daily., Disp: 60 tablet, Rfl: 0      Objective:    BP 134/88 (BP Location: Left Arm, Patient Position: Sitting, Cuff Size: Large)   Pulse 75   Temp 97.7 F (36.5 C) (Temporal)   Ht 5\' 11"  (1.803 m)   Wt (!) 301 lb (136.5 kg)   SpO2 100%   BMI 41.98 kg/m   Wt Readings from Last 3 Encounters:  07/07/23 (!) 301 lb (136.5 kg)  03/21/23 295 lb 4 oz (133.9 kg)  01/03/23 (!) 302 lb 6.4 oz (137.2 kg)    Physical Exam Constitutional:      General: He is not in acute distress.    Appearance: Normal  appearance. He is obese. He is not ill-appearing, toxic-appearing or diaphoretic.  Cardiovascular:     Rate and Rhythm: Normal rate and regular rhythm.  Pulmonary:     Effort: Pulmonary effort is normal.  Musculoskeletal:        General: Normal range of motion.  Neurological:     General: No focal deficit present.     Mental Status: He is alert and oriented to person, place, and time. Mental status is at baseline.  Psychiatric:        Mood and Affect: Mood normal.        Behavior: Behavior normal.        Thought Content: Thought content normal.        Judgment: Judgment normal.           Assessment & Plan:  Anxiety Assessment & Plan: Increase buspirone to 10 mg in am and 5 mg at night time.  After one week can increase to 10 mg twice daily.  Work on anxiety reducing techniques.  Drug contract signed for alprazolam 0.25 mg daily prn anxiety   We can consider lexapro in future if needed.  Orders: -     busPIRone HCl; Take 1 tablet (5 mg total) by mouth 2 (two) times daily.  Dispense: 60 tablet; Refill: 0  Anxiety state -     busPIRone HCl; Take 1 tablet (5 mg total) by mouth 2 (two) times daily.  Dispense: 60 tablet; Refill: 0     Return in about 3 months (around 10/07/2023) for f/u anxiety.  Mort Sawyers, MSN, APRN, FNP-C [facility]

## 2023-07-07 NOTE — Assessment & Plan Note (Addendum)
Increase buspirone to 10 mg in am and 5 mg at night time.  After one week can increase to 10 mg twice daily.  Work on anxiety reducing techniques.  Drug contract signed for alprazolam 0.25 mg daily prn anxiety   We can consider lexapro in future if needed.

## 2023-07-07 NOTE — Patient Instructions (Addendum)
  Restart 5 mg twice daily  After around 2 days can increase to 10 mg in am and 5 mg at night.  If still need slightly more can increase to 10 mg twice daily after about three days.    Regards,   Mort Sawyers FNP-C

## 2023-10-03 DIAGNOSIS — E538 Deficiency of other specified B group vitamins: Secondary | ICD-10-CM | POA: Diagnosis not present

## 2023-10-03 DIAGNOSIS — R0683 Snoring: Secondary | ICD-10-CM | POA: Diagnosis not present

## 2023-10-03 DIAGNOSIS — R202 Paresthesia of skin: Secondary | ICD-10-CM | POA: Diagnosis not present

## 2023-10-03 DIAGNOSIS — E559 Vitamin D deficiency, unspecified: Secondary | ICD-10-CM | POA: Diagnosis not present
